# Patient Record
Sex: Female | Born: 1990 | Race: Black or African American | Hispanic: No | Marital: Single
Health system: Southern US, Community
[De-identification: ages and names within clinical notes are randomized; demographics above are authoritative.]

---
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
## 2011-01-26 LAB — RAPID STREP SCREEN (MED CTR MEBANE ONLY): Streptococcus, Group A Screen (Direct): POSITIVE — AB

## 2022-04-21 ENCOUNTER — Emergency Department: Admission: EM | Admit: 2022-04-21 | Payer: Self-pay | Source: Home / Self Care

## 2022-04-21 MED ORDER — SODIUM CHLORIDE 0.9 % IV BOLUS: 1000.0000 mL | Freq: Once | INTRAVENOUS | Status: DC

## 2022-04-21 NOTE — ED Triage Notes (Addendum)
Entered in error
